# Patient Record
Sex: Male | Born: 2000 | Hispanic: Yes | Marital: Single | Smoking: Never smoker
Health system: Southern US, Community
[De-identification: ages and names within clinical notes are randomized; demographics above are authoritative.]

## PROBLEM LIST (undated history)

## (undated) DIAGNOSIS — T7840XA Allergy, unspecified, initial encounter: Secondary | ICD-10-CM

## (undated) HISTORY — DX: Allergy, unspecified, initial encounter: T78.40XA

---
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
